# Patient Record
Sex: Female | Born: 1935 | Race: Black or African American | Hispanic: No | Marital: Single | Smoking: Never smoker
Health system: Southern US, Community
[De-identification: ages and names within clinical notes are randomized; demographics above are authoritative.]

## PROBLEM LIST (undated history)

## (undated) DIAGNOSIS — M1990 Unspecified osteoarthritis, unspecified site: Secondary | ICD-10-CM

---

## 2017-04-07 ENCOUNTER — Ambulatory Visit (INDEPENDENT_AMBULATORY_CARE_PROVIDER_SITE_OTHER): Payer: Medicare Other | Admitting: Podiatry

## 2017-04-07 ENCOUNTER — Encounter: Payer: Self-pay | Admitting: Podiatry

## 2017-04-07 VITALS — BP 153/69 | HR 68 | Resp 16

## 2017-04-07 DIAGNOSIS — M79676 Pain in unspecified toe(s): Secondary | ICD-10-CM

## 2017-04-07 DIAGNOSIS — B351 Tinea unguium: Secondary | ICD-10-CM

## 2017-04-07 NOTE — Progress Notes (Signed)
  Subjective:  Patient ID: Barbette OrGermaine Enis, female    DOB: [birth year],  MRN: [id number] HPI Chief Complaint  Patient presents with  . Debridement    Requesting nail care and exam - trim toenails, hallux left gets tender sometimes, concerned about toes being tight together    84 y.o. female presents with the above complaint.     No past medical history on file.   Current Outpatient Medications:  .  acetaminophen (TYLENOL) 500 MG tablet, Take 500 mg by mouth as needed., Disp: , Rfl:  .  ibuprofen (ADVIL,MOTRIN) 200 MG tablet, Take 200 mg by mouth as needed., Disp: , Rfl:   No Known Allergies Review of Systems  All other systems reviewed and are negative.  Objective:   Vitals:   04/07/17 1039  BP: (!) 153/69  Pulse: 68  Resp: 16    General: Well developed, nourished, in no acute distress, alert and oriented x3   Dermatological: Skin is warm, dry and supple bilateral. Nails x 10 are well maintained; remaining integument appears unremarkable at this time. There are no open sores, no preulcerative lesions, no rash or signs of infection present.  Nails are thick sharply incurvated painful with some discoloration.  Nails are dystrophic possibly mycotic it appears that they at least have superficial white onychomycosis.  Vascular: Dorsalis Pedis artery and Posterior Tibial artery pedal pulses are 2/4 bilateral with immedate capillary fill time. Pedal hair growth present. No varicosities and no lower extremity edema present bilateral.   Neruologic: Grossly intact via light touch bilateral. Vibratory intact via tuning fork bilateral. Protective threshold with Semmes Wienstein monofilament intact to all pedal sites bilateral. Patellar and Achilles deep tendon reflexes 2+ bilateral. No Babinski or clonus noted bilateral.   Musculoskeletal: No gross boney pedal deformities bilateral. No pain, crepitus, or limitation noted with foot and ankle range of motion bilateral. Muscular strength 5/5  in all groups tested bilateral.  Gait: Unassisted, Nonantalgic.    Radiographs:  None taken today  Assessment & Plan:   Assessment: Pain in limb secondary to sharp incurvated nail margins and thick dystrophic possibly mycotic nails.  Plan: Debridement of toenails 1 through 5 bilateral.  Follow-up with Dr. Caryn BeeMaier for further debridements.     Aloni Chuang T. [place], [place]

## 2017-07-07 ENCOUNTER — Ambulatory Visit: Payer: Medicare Other | Admitting: Podiatry

## 2017-07-10 ENCOUNTER — Ambulatory Visit: Payer: Medicare Other | Admitting: Podiatry

## 2017-07-17 ENCOUNTER — Encounter: Payer: Self-pay | Admitting: Podiatry

## 2017-07-17 ENCOUNTER — Ambulatory Visit (INDEPENDENT_AMBULATORY_CARE_PROVIDER_SITE_OTHER): Payer: Medicare Other | Admitting: Podiatry

## 2017-07-17 DIAGNOSIS — B351 Tinea unguium: Secondary | ICD-10-CM | POA: Diagnosis not present

## 2017-07-17 DIAGNOSIS — M79676 Pain in unspecified toe(s): Secondary | ICD-10-CM | POA: Diagnosis not present

## 2017-07-17 DIAGNOSIS — L608 Other nail disorders: Secondary | ICD-10-CM

## 2017-07-17 NOTE — Progress Notes (Addendum)
Complaint:  Visit Type: Patient returns to my office for continued preventative foot care services. Complaint: Patient states" my nails have grown long and thick and ingrown  and become painful to walk and wear shoes" . The patient presents for preventative foot care services. No changes to ROS>  Patient presents to the office with daughter in law.  Podiatric Exam: Vascular: dorsalis pedis and posterior tibial pulses are palpable bilateral. Capillary return is immediate. Temperature gradient is WNL. Skin turgor WNL  Sensorium: Normal Semmes Weinstein monofilament test. Normal tactile sensation bilaterally. Nail Exam: Pt has thick disfigured discolored nails with subungual debris noted bilateral entire nail hallux through fifth toenails.  Multiple pincer toenails  B/L. Ulcer Exam: There is no evidence of ulcer or pre-ulcerative changes or infection. Orthopedic Exam: Muscle tone and strength are WNL. No limitations in general ROM. No crepitus or effusions noted. Foot type and digits show no abnormalities. Bony prominences are unremarkable. Skin: No Porokeratosis. No infection or ulcers  Diagnosis:  Onychomycosis, , Pain in right toe, pain in left toes  Treatment & Plan Procedures and Treatment: Consent by patient was obtained for treatment procedures.   Debridement of mycotic and hypertrophic toenails, 1 through 5 bilateral and clearing of subungual debris. No ulceration, no infection noted.  Return Visit-Office Procedure: Patient instructed to return to the office for a follow up visit 3 months for continued evaluation and treatment.    Helane Gunther DPM

## 2017-10-15 ENCOUNTER — Encounter: Payer: Self-pay | Admitting: Podiatry

## 2017-10-15 ENCOUNTER — Ambulatory Visit (INDEPENDENT_AMBULATORY_CARE_PROVIDER_SITE_OTHER): Payer: Medicare Other | Admitting: Podiatry

## 2017-10-15 DIAGNOSIS — B351 Tinea unguium: Secondary | ICD-10-CM

## 2017-10-15 DIAGNOSIS — M79676 Pain in unspecified toe(s): Secondary | ICD-10-CM

## 2017-10-15 NOTE — Progress Notes (Signed)
She presents today for follow-up of her painful toenails.  Toenails are long thick yellow dystrophic clinically mycotic painful palpation as well as debridement.  Assessment: Pain in limb secondary onychomycosis.  Plan: Debridement of toenails 1 through 5 bilateral.

## 2017-12-31 DIAGNOSIS — K5909 Other constipation: Secondary | ICD-10-CM | POA: Insufficient documentation

## 2017-12-31 DIAGNOSIS — Z0000 Encounter for general adult medical examination without abnormal findings: Secondary | ICD-10-CM | POA: Insufficient documentation

## 2017-12-31 DIAGNOSIS — M159 Polyosteoarthritis, unspecified: Secondary | ICD-10-CM | POA: Insufficient documentation

## 2018-01-15 DIAGNOSIS — N641 Fat necrosis of breast: Secondary | ICD-10-CM | POA: Insufficient documentation

## 2018-01-21 ENCOUNTER — Ambulatory Visit (INDEPENDENT_AMBULATORY_CARE_PROVIDER_SITE_OTHER): Payer: Medicare Other | Admitting: Podiatry

## 2018-01-21 ENCOUNTER — Encounter: Payer: Self-pay | Admitting: Podiatry

## 2018-01-21 DIAGNOSIS — B351 Tinea unguium: Secondary | ICD-10-CM | POA: Diagnosis not present

## 2018-01-21 DIAGNOSIS — M79676 Pain in unspecified toe(s): Secondary | ICD-10-CM | POA: Diagnosis not present

## 2018-01-21 NOTE — Progress Notes (Signed)
She presents today chief complaint of painful elongated toenails 1 through 5 bilateral.  Objective: Toenails are long thick yellow dystrophic with mycotic painful palpation as well as debridement.  Assessment: Pain in limb center onychomycosis.  Plan: Debridement of toenails 1 through 5 bilateral.

## 2018-01-26 ENCOUNTER — Emergency Department: Payer: Medicare Other

## 2018-01-26 ENCOUNTER — Encounter: Payer: Self-pay | Admitting: Emergency Medicine

## 2018-01-26 ENCOUNTER — Emergency Department
Admission: EM | Admit: 2018-01-26 | Discharge: 2018-01-27 | Disposition: A | Discharge status: Home / Self Care | Payer: Medicare Other | Attending: Student in an Organized Health Care Education/Training Program | Admitting: Student in an Organized Health Care Education/Training Program

## 2018-01-26 ENCOUNTER — Other Ambulatory Visit: Payer: Self-pay

## 2018-01-26 DIAGNOSIS — M79604 Pain in right leg: Secondary | ICD-10-CM | POA: Insufficient documentation

## 2018-01-26 DIAGNOSIS — W0110XA Fall on same level from slipping, tripping and stumbling with subsequent striking against unspecified object, initial encounter: Secondary | ICD-10-CM | POA: Insufficient documentation

## 2018-01-26 DIAGNOSIS — Y92009 Unspecified place in unspecified non-institutional (private) residence as the place of occurrence of the external cause: Secondary | ICD-10-CM

## 2018-01-26 DIAGNOSIS — R600 Localized edema: Secondary | ICD-10-CM

## 2018-01-26 DIAGNOSIS — R260 Ataxic gait: Secondary | ICD-10-CM | POA: Diagnosis not present

## 2018-01-26 DIAGNOSIS — W19XXXA Unspecified fall, initial encounter: Secondary | ICD-10-CM

## 2018-01-26 HISTORY — DX: Unspecified osteoarthritis, unspecified site: M19.90

## 2018-01-26 LAB — CBC WITH DIFFERENTIAL/PLATELET
Abs Immature Granulocytes: 0.09 10*3/uL — ABNORMAL HIGH (ref 0.00–0.07)
BASOS PCT: 0 %
Basophils Absolute: 0 10*3/uL (ref 0.0–0.1)
EOS ABS: 0 10*3/uL (ref 0.0–0.5)
EOS PCT: 0 %
HCT: 31.5 % — ABNORMAL LOW (ref 36.0–46.0)
Hemoglobin: 10.2 g/dL — ABNORMAL LOW (ref 12.0–15.0)
Immature Granulocytes: 1 %
Lymphocytes Relative: 11 %
Lymphs Abs: 1 10*3/uL (ref 0.7–4.0)
MCH: 32.1 pg (ref 26.0–34.0)
MCHC: 32.4 g/dL (ref 30.0–36.0)
MCV: 99.1 fL (ref 80.0–100.0)
MONO ABS: 1.3 10*3/uL — AB (ref 0.1–1.0)
Monocytes Relative: 14 %
Neutro Abs: 7 10*3/uL (ref 1.7–7.7)
Neutrophils Relative %: 74 %
PLATELETS: 292 10*3/uL (ref 150–400)
RBC: 3.18 MIL/uL — AB (ref 3.87–5.11)
RDW: 13.2 % (ref 11.5–15.5)
WBC: 9.4 10*3/uL (ref 4.0–10.5)
nRBC: 0 % (ref 0.0–0.2)

## 2018-01-26 LAB — URINALYSIS, COMPLETE (UACMP) WITH MICROSCOPIC
Bacteria, UA: NONE SEEN
Bilirubin Urine: NEGATIVE
Glucose, UA: NEGATIVE mg/dL
KETONES UR: 5 mg/dL — AB
Leukocytes, UA: NEGATIVE
Nitrite: NEGATIVE
PH: 6 (ref 5.0–8.0)
Protein, ur: NEGATIVE mg/dL
SPECIFIC GRAVITY, URINE: 1.014 (ref 1.005–1.030)
SQUAMOUS EPITHELIAL / LPF: NONE SEEN (ref 0–5)

## 2018-01-26 LAB — COMPREHENSIVE METABOLIC PANEL
ALT: 17 U/L (ref 0–44)
AST: 27 U/L (ref 15–41)
Albumin: 3.5 g/dL (ref 3.5–5.0)
Alkaline Phosphatase: 92 U/L (ref 38–126)
Anion gap: 4 — ABNORMAL LOW (ref 5–15)
BUN: 24 mg/dL — AB (ref 8–23)
CALCIUM: 8.8 mg/dL — AB (ref 8.9–10.3)
CHLORIDE: 105 mmol/L (ref 98–111)
CO2: 29 mmol/L (ref 22–32)
CREATININE: 0.82 mg/dL (ref 0.44–1.00)
GFR calc non Af Amer: 59 mL/min — ABNORMAL LOW (ref >60)
Glucose, Bld: 123 mg/dL — ABNORMAL HIGH (ref 70–99)
POTASSIUM: 4.2 mmol/L (ref 3.5–5.1)
SODIUM: 138 mmol/L (ref 135–145)
TOTAL PROTEIN: 6.9 g/dL (ref 6.5–8.1)
Total Bilirubin: 0.9 mg/dL (ref 0.3–1.2)

## 2018-01-26 LAB — TROPONIN I

## 2018-01-26 MED ORDER — SODIUM CHLORIDE 0.9 % IV BOLUS
500.0000 mL | Freq: Once | INTRAVENOUS | Status: AC
  Administered 2018-01-26: 500 mL via INTRAVENOUS

## 2018-01-26 MED ORDER — ACETAMINOPHEN 325 MG PO TABS
650.0000 mg | ORAL_TABLET | Freq: Once | ORAL | Status: AC
  Administered 2018-01-26: 650 mg via ORAL
  Filled 2018-01-26: qty 2

## 2018-01-26 NOTE — ED Triage Notes (Signed)
Patient presents to the ED post fall today.  Patient states her head didn't hit the floor.  Patient denies loss of consciousness.  Patient states she was walking to get to her recliner.  Patient states she is unsure of why she fell today.  Patient states this is her second fall within a week.  Patient's right leg appears very swollen and patient states this occurred prior to today's fall.  Patient reports more difficulty walking than usual.

## 2018-01-26 NOTE — ED Provider Notes (Signed)
[facility] Emergency Department Provider Note  ____________________________________________   First MD Initiated Contact with Patient 01/26/18 1858     (approximate)  I have reviewed the triage vital signs and the nursing notes.   HISTORY  Chief Complaint Fall and Leg Swelling  HPI Ariel Robinson is a 84 y.o. female who presents to the emergency department for treatment and evaluation after a non-syncopal fall today.  She recalls walking to get into her recliner and then fell for unknown reason.  She states this is her second fall within a week.  She states that her right lower extremity is more painful and swollen today than usual.  Over the past 24 hours, she has been unable to ambulate without extreme right lower extremity pain. She lives in an independent living facility. Family have been attempting to get additional help, but it has not happened yet. Patient states that as long as she doesn't attempt to move her leg, it doesn't hurt. No alleviating measures prior to arrival.  Past Medical History:  Diagnosis Date  . Arthritis     Patient Active Problem List   Diagnosis Date Noted  . 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 Chronic constipation 12/31/2017  . Generalized osteoarthritis of multiple sites 12/31/2017  . Medicare annual wellness visit, initial 12/31/2017    History reviewed. No pertinent surgical history.  Prior to Admission medications   Medication Sig Start Date End Date Taking? Authorizing Provider  acetaminophen (TYLENOL) 500 MG tablet Take 500 mg by mouth as needed.    [provider]  Ferrous Gluconate 324 (37.5 Fe) MG TABS Take by mouth. 01/16/18   [provider]  ibuprofen (ADVIL,MOTRIN) 200 MG tablet Take 200 mg by mouth as needed.    [provider]    Allergies Other  No family history on file.  Social History Social History   Tobacco Use  . Smoking status: Never Smoker  . Smokeless  tobacco: Never Used  Substance Use Topics  . Alcohol use: No    Frequency: Never  . Drug use: Not on file    Review of Systems  Constitutional: No fever/chills Eyes: No visual changes. ENT: No sore throat. Cardiovascular: Denies chest pain. Respiratory: Denies shortness of breath. Gastrointestinal: No abdominal pain.  No nausea, no vomiting.  No diarrhea.  No constipation. Genitourinary: Negative for dysuria. Musculoskeletal: Negative for back pain. Positive for RLE pain. Skin: Negative for rash. Negative for open wound or lesion.  Neurological: Negative for headaches, focal weakness or numbness.  ____________________________________________   PHYSICAL EXAM:  VITAL SIGNS: ED Triage Vitals [01/26/18 1714]  Enc Vitals Group     BP 136/88     Pulse Rate 94     Resp 18     Temp 97.8 F (36.6 C)     Temp Source Oral     SpO2 96 %     Weight 104 lb (47.2 kg)     Height 5\' 4"  (1.626 m)     Head Circumference      Peak Flow      Pain Score      Pain Loc      Pain Edu?      Excl. in GC?     Constitutional: Alert and oriented. Well appearing and in no acute distress. Eyes: Conjunctivae are normal Head: Atraumatic. Nose: No congestion/rhinnorhea. Mouth/Throat: Mucous membranes are moist.  Oropharynx non-erythematous. Neck: No stridor. No mildline tenderness. Cardiovascular: Normal rate, regular rhythm. Grossly normal heart  sounds.  Good peripheral circulation. Respiratory: Normal respiratory effort.  No retractions. Lungs CTAB. Gastrointestinal: Soft and nontender. No distention. No abdominal bruits. No CVA tenderness. Musculoskeletal: RLE nonpitting diffuse edema from above knee to toes. FROM of ankle and toes. Limited flexion of the right knee secondary to pain. Neurologic:  Normal speech and language. No gross focal neurologic deficits are appreciated. Skin:  Skin is warm, dry and intact. No rash noted. Diffuse edema of the RLE without open wound or lesion.    Psychiatric: Mood and affect are normal. Speech and behavior are normal.  ____________________________________________   LABS (all labs ordered are listed, but only abnormal results are displayed)  Labs Reviewed  COMPREHENSIVE METABOLIC PANEL - Abnormal; Notable for the following components:      Result Value   Glucose, Bld 123 (*)    BUN 24 (*)    Calcium 8.8 (*)    GFR calc non Af Amer 59 (*)    Anion gap 4 (*)    All other components within normal limits  CBC WITH DIFFERENTIAL/PLATELET - Abnormal; Notable for the following components:   RBC 3.18 (*)    Hemoglobin 10.2 (*)    HCT 31.5 (*)    Monocytes Absolute 1.3 (*)    Abs Immature Granulocytes 0.09 (*)    All other components within normal limits  URINALYSIS, COMPLETE (UACMP) WITH MICROSCOPIC - Abnormal; Notable for the following components:   Color, Urine YELLOW (*)    APPearance CLEAR (*)    Hgb urine dipstick SMALL (*)    Ketones, ur 5 (*)    All other components within normal limits  TROPONIN I   ____________________________________________  EKG  ED ECG REPORT I, Emalyn Schou, FNP-BC, personally viewed and interpreted this ECG.   Date: 01/26/2018  EKG Time: 2037  Rate: 84  Rhythm: sinus rhythm with frequent PAC  Axis: normal  Intervals:none  ST&T Change: No ST elevation  ____________________________________________  RADIOLOGY  ED MD interpretation:  Nonocclusive thrombus in the right femoral vein without occlusive DVT.  Official radiology report(s): Dg Tibia/fibula Right  Result Date: 01/26/2018 CLINICAL DATA:  Initial evaluation for acute trauma, fall. EXAM: RIGHT FEMUR 2 VIEWS; RIGHT TIBIA AND FIBULA - 2 VIEW COMPARISON:  None. FINDINGS: No acute fracture or dislocation seen about the right femur or right tibia/fibula. Severe osteoarthritic changes present at the right hip with extensive joint space loss, subchondral sclerosis, and osteophytosis. Associated chronic flattening of the right femoral  head. Osteoarthritic changes noted about the right knee as well, greatest within the lateral femorotibial joint space compartment. Bones are osteopenic. No acute soft tissue abnormality. Vascular calcifications noted posterior to the knee. IMPRESSION: 1. No acute fracture or dislocation about the right femur or right tibia/fibula. 2. Severe degenerative osteoarthrosis at the right hip. 3. Osteopenia. Electronically Signed   By: Rise Mu M.D.   On: 01/26/2018 20:40   Ct Head Wo Contrast  Result Date: 01/26/2018 CLINICAL DATA:  84 year old female status post fall today. Ataxia. EXAM: CT HEAD WITHOUT CONTRAST TECHNIQUE: Contiguous axial images were obtained from the base of the skull through the vertex without intravenous contrast. COMPARISON:  None. FINDINGS: Brain: Cerebral volume is within normal limits for age. Patchy and confluent bilateral cerebral white matter hypodensity. Small area of chronic appearing cortical encephalomalacia in the left middle frontal gyrus (series 2, image 19). This is possible small chronic infarct in the left cerebellum on series 2, image 7. No midline shift, ventriculomegaly, mass effect, evidence of mass  lesion, intracranial hemorrhage or evidence of cortically based acute infarction. Vascular: Calcified atherosclerosis at the skull base. No suspicious intracranial vascular hyperdensity. Skull: Chronic and possibly congenital appearing deformities of the vertex and bilateral posterior convexities with smooth osseous remodeling (e.g. Sagittal image 24). No acute osseous abnormality identified. Sinuses/Orbits: Bilateral tympanic cavities and mastoids are clear. Chronic sphenoid sinusitis with mucoperiosteal thickening and some partially calcified internal contents. Opacified left posterior ethmoid air cell. Other visible paranasal sinuses are well pneumatized. Other: Chronic appearing posterolateral and vertex scalp and skull deformities. No scalp hematoma, acute orbit  or scalp soft tissue finding. IMPRESSION: 1. No acute intracranial abnormality. No acute traumatic injury identified. 2. Evidence of chronic small and medium-sized vessel ischemia. 3. Chronic sphenoid sinusitis. Electronically Signed   By: Odessa Fleming M.D.   On: 01/26/2018 20:55   US Venous Img Lower Unilateral Right  Result Date: 01/26/2018 CLINICAL DATA:  84 year old female with right lower extremity pain and swelling for 2 days. EXAM: RIGHT LOWER EXTREMITY VENOUS DOPPLER ULTRASOUND TECHNIQUE: Gray-scale sonography with graded compression, as well as color Doppler and duplex ultrasound were performed to evaluate the lower extremity deep venous systems from the level of the common femoral vein and including the common femoral, femoral, profunda femoral, popliteal and calf veins including the posterior tibial, peroneal and gastrocnemius veins when visible. The superficial great saphenous vein was also interrogated. Spectral Doppler was utilized to evaluate flow at rest and with distal augmentation maneuvers in the common femoral, femoral and popliteal veins. COMPARISON:  None. FINDINGS: Contralateral Common Femoral Vein: Respiratory phasicity is normal and symmetric with the symptomatic side. No evidence of thrombus. Normal compressibility. Common Femoral Vein: No evidence of thrombus. Normal compressibility, respiratory phasicity and response to augmentation. Saphenofemoral Junction: No evidence of thrombus. Normal compressibility and flow on color Doppler imaging. Profunda Femoral Vein: No evidence of thrombus. Normal compressibility and flow on color Doppler imaging. Femoral Vein: The proximal right femoral vein is incompletely compressible as seen on image 17, but remains patent. Popliteal Vein: No evidence of thrombus. Normal compressibility, respiratory phasicity and response to augmentation. Calf Veins: Suboptimal but no thrombus identified. Compressibility and color Doppler seen within normal limits. Venous  Reflux:  None. Other Findings: There is subcutaneous edema posterior to the knee (image 41 and 42). No discrete fluid collection. IMPRESSION: 1. Difficult to exclude nonocclusive thrombus in the right femoral vein (image 17). But there is no occlusive DVT identified in the right lower extremity. 2. Nonspecific subcutaneous edema posterior to the knee. No discrete popliteal fluid collection identified. Electronically Signed   By: Odessa Fleming M.D.   On: 01/26/2018 18:53   Dg Femur Min 2 Views Right  Result Date: 01/26/2018 CLINICAL DATA:  Initial evaluation for acute trauma, fall. EXAM: RIGHT FEMUR 2 VIEWS; RIGHT TIBIA AND FIBULA - 2 VIEW COMPARISON:  None. FINDINGS: No acute fracture or dislocation seen about the right femur or right tibia/fibula. Severe osteoarthritic changes present at the right hip with extensive joint space loss, subchondral sclerosis, and osteophytosis. Associated chronic flattening of the right femoral head. Osteoarthritic changes noted about the right knee as well, greatest within the lateral femorotibial joint space compartment. Bones are osteopenic. No acute soft tissue abnormality. Vascular calcifications noted posterior to the knee. IMPRESSION: 1. No acute fracture or dislocation about the right femur or right tibia/fibula. 2. Severe degenerative osteoarthrosis at the right hip. 3. Osteopenia. Electronically Signed   By: Rise Mu M.D.   On: 01/26/2018 20:40    ____________________________________________  PROCEDURES  Procedure(s) performed: None  Procedures  Critical Care performed: No  ____________________________________________   INITIAL IMPRESSION / ASSESSMENT AND PLAN / ED COURSE  As part of my medical decision making, I reviewed the following data within the electronic MEDICAL RECORD NUMBER Notes from prior ED visits   84 year old female presenting to the emergency department for treatment and evaluation after a non-syncopal fall.  Patient complains of  severe right lower extremity pain and is now unable to ambulate.  She had a more traumatic fall January 15, 2018 for which she was evaluated by urgent care and primary care. Family are concerned that she needs additional home assistance since she now is unable to tolerate ambulation due to leg pain.  Ultrasound of the right lower extremity is negative for occlusive deep vein thrombosis.  There is a nonocclusive superficial clot, otherwise exam is negative.  Patient does not qualify for admission based on labs and imaging.  She will be held in the emergency department for PT, OT, and case management.  This was discussed with the patient and her family who agree with the plan.      ____________________________________________   FINAL CLINICAL IMPRESSION(S) / ED DIAGNOSES  Final diagnoses:  Leg edema, right  Fall at home, initial encounter  Leg pain, right     ED Discharge Orders    None       Note:  This document was prepared using Dragon voice recognition software and may include unintentional dictation errors.    Chinita Pester, FNP 01/27/18 1610    Willy Eddy, MD 01/27/18 [phone]

## 2018-01-27 DIAGNOSIS — M79604 Pain in right leg: Secondary | ICD-10-CM | POA: Diagnosis not present

## 2018-01-27 MED ORDER — ACETAMINOPHEN 500 MG PO TABS: 500.0000 mg | ORAL_TABLET | Freq: Three times a day (TID) | ORAL | Status: DC | PRN

## 2018-01-27 MED ORDER — FERROUS GLUCONATE 324 (38 FE) MG PO TABS
324.0000 mg | ORAL_TABLET | Freq: Every morning | ORAL | Status: DC
  Filled 2018-01-27 (×2): qty 1

## 2018-01-27 MED ORDER — IBUPROFEN 400 MG PO TABS: 200.0000 mg | ORAL_TABLET | ORAL | Status: DC | PRN

## 2018-01-27 NOTE — ED Notes (Signed)
[facility]  Removed EKG stickers from chest Pt ready for transport to facility   lmedt

## 2018-01-27 NOTE — ED Notes (Signed)
Patient sat up in bed and given breakfast. Family at bedside. Will continue to monitor.

## 2018-01-27 NOTE — ED Notes (Signed)
Breakfast meal tray ordered for patient at this time.

## 2018-01-27 NOTE — ED Notes (Signed)
Pharmacy called and notified that this RN is unable to pull ferrous gluconate from pyxis. Charles from pharmacy states he will send it via tube system. Will administer once medication is available.

## 2018-01-27 NOTE — ED Notes (Signed)
[facility]

## 2018-01-27 NOTE — ED Notes (Signed)
Pt in room with family member at bedside. Denies pain. Informed that lunch will be brought soon.

## 2018-01-27 NOTE — Evaluation (Signed)
Occupational Therapy Evaluation Patient Details Name: Ariel Robinson MRN: [id number] DOB: 1919-06-30 Today's Date: 01/27/2018    History of Present Illness 84 y.o. female who presented to ED for treatment and evaluation after a non-syncopal fall today.  x2 this week. She states that her right lower extremity is more painful and swollen today than usual.     Clinical Impression   Pt seen for OT evaluation this date. Prior to hospital admission, pt was independent with ADL, mod indep with mobility using rollator and living by herself in an ILF. Pt requires assist for transportation and groceries/meals from family but otherwise independent, including medication mgt (vitamins, pain meds, no prescription meds).  Currently pt demonstrates impairments in strength, balance, activity tolerance, and pain requiring mod-max assist for LB ADL and CGA to Min A for mobility with a 2WW. Pt/family educated in home/routines modifications and falls prevention strategies to maximize safety/independence in the home. Pt would benefit from skilled OT to address noted impairments and functional limitations (see below for any additional details) in order to maximize safety and independence while minimizing falls risk and caregiver burden.  Upon hospital discharge, recommend pt discharge to STR.    Follow Up Recommendations  SNF    Equipment Recommendations  Other (comment)(TBD - may benefit from [facility])    Recommendations for Other Services       Precautions / Restrictions Precautions Precautions: Fall Restrictions Weight Bearing Restrictions: No      Mobility Bed Mobility Overal bed mobility: Needs Assistance Bed Mobility: Supine to Sit;Sit to Supine     Supine to sit: Min guard;HOB elevated Sit to supine: Mod assist   General bed mobility comments: for LE management. Assist provided at ankles for repositioning in bed (bridges to scoot up in bed), verbal/tactile cues needed  Transfers Overall  transfer level: Needs assistance Equipment used: Rolling walker (2 wheeled) Transfers: Sit to/from Stand Sit to Stand: Min guard;From elevated surface              Balance Overall balance assessment: Mild deficits observed, not formally tested                                         ADL either performed or assessed with clinical judgement   ADL Overall ADL's : Needs assistance/impaired Eating/Feeding: Sitting;Independent   Grooming: Sitting;Independent   Upper Body Bathing: Sitting;Supervision/ safety   Lower Body Bathing: Sit to/from stand;Moderate assistance;Maximal assistance   Upper Body Dressing : Sitting;Supervision/safety   Lower Body Dressing: Sit to/from stand;Maximal assistance;Moderate assistance   Toilet Transfer: RW;Ambulation;Min guard;Minimal assistance;BSC                   Vision Baseline Vision/History: Wears glasses Wears Glasses: At all times Patient Visual Report: No change from baseline       Perception     Praxis      Pertinent Vitals/Pain Pain Assessment: Faces Faces Pain Scale: Hurts even more Pain Location: BLE, R>L, pt states the pain "moves around" and "comes and goes" Pain Descriptors / Indicators: Aching;Sharp;Grimacing;Guarding Pain Intervention(s): Limited activity within patient's tolerance;Monitored during session;Premedicated before session;Repositioned     Hand Dominance Right   Extremity/Trunk Assessment Upper Extremity Assessment Upper Extremity Assessment: Generalized weakness(grossly 3+ to 4-/5 bilaterally) RUE Deficits / Details: grossly 3+/5 including grip LUE Deficits / Details: grossly 3+/5 including grip   Lower Extremity Assessment Lower Extremity Assessment: Generalized weakness(pt unable  to lift BLE off floor without pain, mild +1 pitting edema R>L) RLE Deficits / Details: presence of edema       Communication Communication Communication: HOH   Cognition Arousal/Alertness:  Awake/alert Behavior During Therapy: WFL for tasks assessed/performed Overall Cognitive Status: Within Functional Limits for tasks assessed                                 General Comments: with cues, pt able to recall name of hospital, otherwise alert and oriented   General Comments       Exercises Other Exercises Other Exercises: pt/family instructed in falls prevention strategies, specific to bathroom    Shoulder Instructions      Home Living Family/patient expects to be discharged to:: Private residence(ILF) Living Arrangements: Alone Available Help at Discharge: Family Type of Home: Independent living facility Home Access: Level entry     Home Layout: One level     Bathroom Shower/Tub: Producer, television/film/videoWalk-in shower   Bathroom Toilet: Standard Bathroom Accessibility: Yes   Home Equipment: Environmental consultantWalker - 4 wheels;Toilet riser          Prior Functioning/Environment Level of Independence: Needs assistance  Gait / Transfers Assistance Needed: Patient ambulates short household distances with rollator ADL's / Homemaking Assistance Needed: until recent falls, family reports patient was dressing/bathing independently   Comments: Family has been providing meals/IADLs assistance. Reports patient has difficulty ambulating to dining facilities due to incline that is along the way. Reports x2 falls this week. Independent with med mgt (does not take prescription medication)        OT Problem List: Decreased strength;Decreased knowledge of use of DME or AE;Increased edema;Decreased activity tolerance;Pain;Impaired balance (sitting and/or standing)      OT Treatment/Interventions: Self-care/ADL training;Balance training;Therapeutic exercise;Therapeutic activities;Energy conservation;DME and/or AE instruction;Patient/family education    OT Goals(Current goals can be found in the care plan section) Acute Rehab OT Goals Patient Stated Goal: pt wants to return to PLOF OT Goal  Formulation: With patient/family Time For Goal Achievement: 02/10/18 Potential to Achieve Goals: Good ADL Goals Pt Will Perform Lower Body Dressing: with min assist;sit to/from stand;with adaptive equipment Pt Will Transfer to Toilet: with supervision;ambulating(elevated commode, LRAD for amb)  OT Frequency: Min 2X/week   Barriers to D/C:            Co-evaluation              AM-PAC PT "6 Clicks" Daily Activity     Outcome Measure Help from another person eating meals?: None Help from another person taking care of personal grooming?: None Help from another person toileting, which includes using toliet, bedpan, or urinal?: A Little Help from another person bathing (including washing, rinsing, drying)?: A Lot Help from another person to put on and taking off regular upper body clothing?: A Little Help from another person to put on and taking off regular lower body clothing?: A Lot 6 Click Score: 18   End of Session    Activity Tolerance: Patient tolerated treatment well Patient left: in bed;with call bell/phone within reach;with family/visitor present  OT Visit Diagnosis: Other abnormalities of gait and mobility (R26.89);Repeated falls (R29.6);Muscle weakness (generalized) (M62.81);Pain Pain - Right/Left: Right(both, R>L) Pain - part of body: Leg                Time: 1007-1035 OT Time Calculation (min): 28 min Charges:  OT General Charges $OT Visit: 1 Visit OT Evaluation $OT Eval Low  Complexity: 1 Low OT Treatments $Self Care/Home Management : 8-22 mins  Richrd Prime, MPH, MS, OTR/L ascom [phone] 01/27/18, 12:23 PM

## 2018-01-27 NOTE — Evaluation (Signed)
Physical Therapy Evaluation Patient Details Name: Ariel Robinson MRN: [id number] DOB: 11-10-1919 Today's Date: 01/27/2018   History of Present Illness  84 y.o. female who presented to ED for treatment and evaluation after a non-syncopal fall today.  x2 this week. She states that her right lower extremity is more painful and swollen today than usual.    Clinical Impression  Patient is easily woken at start of session, A&Ox4, HOH, family at bedside. Family provided majority of PLOF, pt lives alone in independent living facility, previously ambulated with rollator. Pt until recently was performing ADLs, s/p falls has been having difficulty dressing/bathing. Facility provides cleaning, and has dining services but pt has been unable to attend per family due to an inclined surface preventing ambulation. Family has been assisting with meal prep, grocery shopping, etc. The patient and family also report x2 falls this week.   Upon assessment patient needed significantly extended time to perform supine to sit with CGA, sit to supine modAx1 for LE management. Ambulated with RW and CGA~2012ft in room, patient fatigued. Physical assist as well as mod verbal/tactile cues needed to reposition in the bed. The patient exhibited decreased strength, endurance, activity tolerance, mobility, and ambulation compared to PLOF and would benefit from skilled PT to address these limitations. Current recommendation is STR due to level of assist needed, current functional ability status, and decreased caregiver support at home.     Follow Up Recommendations SNF    Equipment Recommendations  Other (comment)(TBD at next venue of care)    Recommendations for Other Services       Precautions / Restrictions Precautions Precautions: Fall Restrictions Weight Bearing Restrictions: No      Mobility  Bed Mobility Overal bed mobility: Needs Assistance Bed Mobility: Supine to Sit;Sit to Supine     Supine to sit: Min  guard;HOB elevated Sit to supine: Mod assist   General bed mobility comments: for LE management. Assist provided at ankles for repositioning in bed (bridges to scoot up in bed), verbal/tactile cues needed  Transfers Overall transfer level: Needs assistance Equipment used: Rolling walker (2 wheeled) Transfers: Sit to/from Stand Sit to Stand: Min guard;From elevated surface            Ambulation/Gait Ambulation/Gait assistance: Min guard Gait Distance (Feet): 12 Feet Assistive device: Rolling walker (2 wheeled)   Gait velocity: significantly decreased   General Gait Details: shuffling gait, very short strides, decreased step length bilaterally  Stairs            Wheelchair Mobility    Modified Rankin (Stroke Patients Only)       Balance Overall balance assessment: Mild deficits observed, not formally tested                                           Pertinent Vitals/Pain Pain Assessment: Faces Faces Pain Scale: Hurts little more Pain Location: LE    Home Living Family/patient expects to be discharged to:: Private residence Living Arrangements: Alone Available Help at Discharge: Family Type of Home: Independent living facility Home Access: Level entry     Home Layout: One level Home Equipment: Environmental consultantWalker - 4 wheels;Toilet riser      Prior Function Level of Independence: Needs assistance   Gait / Transfers Assistance Needed: Patient ambulates short household distances with rollator  ADL's / Homemaking Assistance Needed: until recent falls, family reports patient was dressing/bathing independently  Comments: Family has been providing meals/IADLs assistance. Reports patient has difficulty ambulating to dining facilities due to incline that is along the way. Reports x2 falls this week.     Hand Dominance        Extremity/Trunk Assessment   Upper Extremity Assessment Upper Extremity Assessment: RUE deficits/detail;LUE  deficits/detail RUE Deficits / Details: grossly 3+/5 including grip LUE Deficits / Details: grossly 3+/5 including grip    Lower Extremity Assessment Lower Extremity Assessment: Generalized weakness(Pt had complaints of BLE pain, MMT deferred, patient able to perform heel slides, hip abduction/adduction AAROM) RLE Deficits / Details: presence of edema       Communication   Communication: HOH  Cognition Arousal/Alertness: Awake/alert Behavior During Therapy: WFL for tasks assessed/performed Overall Cognitive Status: Within Functional Limits for tasks assessed                                        General Comments      Exercises     Assessment/Plan    PT Assessment Patient needs continued PT services  PT Problem List Decreased strength;Decreased range of motion;Decreased knowledge of use of DME;Decreased activity tolerance;Decreased safety awareness;Decreased balance;Pain;Decreased mobility       PT Treatment Interventions DME instruction;Balance training;Gait training;Neuromuscular re-education;Functional mobility training;Patient/family education;Therapeutic activities;Therapeutic exercise    PT Goals (Current goals can be found in the Care Plan section)  Acute Rehab PT Goals Patient Stated Goal: Patient wants to get up and moving PT Goal Formulation: With patient Time For Goal Achievement: 02/10/18 Potential to Achieve Goals: Good    Frequency Min 2X/week   Barriers to discharge Decreased caregiver support      Co-evaluation               AM-PAC PT "6 Clicks" Daily Activity  Outcome Measure Difficulty turning over in bed (including adjusting bedclothes, sheets and blankets)?: A Lot Difficulty moving from lying on back to sitting on the side of the bed? : Unable Difficulty sitting down on and standing up from a chair with arms (e.g., wheelchair, bedside commode, etc,.)?: Unable Help needed moving to and from a bed to chair (including a  wheelchair)?: A Lot Help needed walking in hospital room?: A Little Help needed climbing 3-5 steps with a railing? : Total 6 Click Score: 10    End of Session Equipment Utilized During Treatment: Gait belt Activity Tolerance: Patient limited by fatigue;Patient limited by pain Patient left: in bed;with family/visitor present;with call bell/phone within reach Nurse Communication: Mobility status PT Visit Diagnosis: Unsteadiness on feet (R26.81);Other abnormalities of gait and mobility (R26.89);Difficulty in walking, not elsewhere classified (R26.2);Muscle weakness (generalized) (M62.81);History of falling (Z91.81)    Time: 1610-9604 PT Time Calculation (min) (ACUTE ONLY): 42 min   Charges:   PT Evaluation $PT Eval Low Complexity: 1 Low PT Treatments $Therapeutic Activity: 23-37 mins        Olga Coaster PT, DPT 9:32 AM,01/27/18 [phone]

## 2018-01-27 NOTE — Clinical Social Work Note (Signed)
Clinical Social Work Assessment  Patient Details  Name: Barbette OrGermaine Fye MRN: [id number] Date of Birth: June 01, 1919  Date of referral:  01/27/18               Reason for consult:  Facility Placement                Permission sought to share information with:  Family Supports, Magazine features editoracility Contact Representative Permission granted to share information::  Yes, Verbal Permission Granted  Name::     Lenn CalHARWOOD,RALPH Son [phone]  [phone] or Antony OdeaHarwood, Donna Daughter   [phone]   Agency::  SNF admissions  Relationship::     Contact Information:     Housing/Transportation Living arrangements for the past 2 months:  Independent Living Facility Source of Information:  Adult Children, Patient Patient Interpreter Needed:  None Criminal Activity/Legal Involvement Pertinent to Current Situation/Hospitalization:  No - Comment as needed Significant Relationships:  Adult Children Lives with:    Do you feel safe going back to the place where you live?  No Need for family participation in patient care:  Yes (Comment)  Care giving concerns:  Patient and family feel she needs some short term rehab before returning back home.  Social Worker assessment / plan: Patient is a 84 year old female who is alert and oriented x4.  Patient [facility].  Patient has been at independent living for several years.  Patient and family stated that she has not been to rehab before, CSW explained to patient and her family the process to find placement and what to expect at snf.  Patient's family were informed that since she has not had a qualifying stay, they will have to pay for room and board.  Patient's family expressed understanding, and stated that patient is not safe to return back to her home currently, and they would like to pay private for SNF placement.  CSW discussed with patient and family and they gave CSW permission to [place] search [place].  Patient and family did not have any other questions or concerns.  Employment status:  Retired Health and safety inspectornsurance information:  Medicare PT Recommendations:  Skilled Nursing Facility Information / Referral to community resources:     Patient/Family's Response to care:  Patient and family are agreeable to going to SNF for short term rehab.  Patient/Family's Understanding of and Emotional Response to Diagnosis, Current Treatment, and Prognosis:  Patient's family are hopeful that [facility].  Emotional Assessment Appearance:  Appears stated age Attitude/Demeanor/Rapport:    Affect (typically observed):  Appropriate, Stable, Pleasant, Calm Orientation:  Oriented to Self, Oriented to Place, Oriented to  Time, Oriented to Situation Alcohol / Substance use:  Not Applicable Psych involvement (Current and /or in the community):  No (Comment)  Discharge Needs  Concerns to be addressed:  Care Coordination, Lack of Support Readmission within the last 30 days:  No Current discharge risk:  Lack of support system Barriers to Discharge:  No Barriers Identified   Darleene Cleavernterhaus, Jarrin Staley R, LCSWA 01/27/2018, 6:09 PM

## 2018-01-27 NOTE — NC FL2 (Signed)
  [place] MEDICAID FL2 LEVEL OF CARE SCREENING TOOL     IDENTIFICATION  Patient Name: Ariel Robinson Birthdate: 1919-11-08 Sex: female Admission Date (Current Location): 01/26/2018  [place]   Facility and Address:  [facility], [address], [place], [place] [address]      Provider Number: [id number]  Attending Physician Name and Address:  Ileana RoupJames McShane, MD Relative Name and Phone Number:  Lenn CalHARWOOD,RALPH Son [phone]  [phone] or Antony OdeaHarwood, Donna Daughter   [phone]     Current Level of Care: Hospital Recommended Level of Care: Skilled Nursing Facility Prior Approval Number:    Date Approved/Denied:   PASRR Number: [id number]  Discharge Plan: SNF    Current Diagnoses: Patient Active Problem List   Diagnosis Date Noted  . 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 Chronic constipation 12/31/2017  . Generalized osteoarthritis of multiple sites 12/31/2017  . Medicare annual wellness visit, initial 12/31/2017    Orientation RESPIRATION BLADDER Height & Weight     Time, Situation, Place, Self  Normal Continent Weight: 104 lb (47.2 kg) Height:  5\' 4"  (162.6 cm)  BEHAVIORAL SYMPTOMS/MOOD NEUROLOGICAL BOWEL NUTRITION STATUS      Continent Diet(Regular diet)  AMBULATORY STATUS COMMUNICATION OF NEEDS Skin   Limited Assist Verbally Normal                       Personal Care Assistance Level of Assistance  Bathing, Feeding, Dressing Bathing Assistance: Limited assistance Feeding assistance: Independent Dressing Assistance: Limited assistance     Functional Limitations Info  Hearing, Sight, Speech Sight Info: Adequate Hearing Info: Impaired Speech Info: Adequate    SPECIAL CARE FACTORS FREQUENCY  PT (By licensed PT), OT (By licensed OT)     PT Frequency: 5x a week OT Frequency: 5x a week            Contractures Contractures Info: Not present    Additional Factors Info  Code Status,  Allergies Code Status Info: Full Code Allergies Info: Metal objects           Current Medications (01/27/2018):  This is the current hospital active medication list Current Facility-Administered Medications  Medication Dose Route Frequency Provider Last Rate Last Dose  . acetaminophen (TYLENOL) tablet 500 mg  500 mg Oral Q8H PRN Darci CurrentBrown, Zinc N, MD      . ferrous gluconate [employer] [place](FERGON) tablet 324 mg  324 mg Oral q morning - 10a Darci CurrentBrown, [place] N, MD      . ibuprofen (ADVIL,MOTRIN) tablet 200 mg  200 mg Oral PRN Darci CurrentBrown, Barren N, MD       Current Outpatient Medications  Medication Sig Dispense Refill  . acetaminophen (TYLENOL) 500 MG tablet Take 500 mg by mouth as needed.    Marland Kitchen. ibuprofen (ADVIL,MOTRIN) 200 MG tablet Take 200 mg by mouth as needed.    . Ferrous Gluconate 324 (37.5 Fe) MG TABS Take by mouth.       Discharge Medications: Please see discharge summary for a list of discharge medications.  Relevant Imaging Results:  Relevant Lab Results:   Additional Information SSN [id number]  Darleene Cleavernterhaus, Loukisha Gunnerson R, [place]WA

## 2018-01-27 NOTE — ED Notes (Addendum)
PT/OT at bedside.

## 2018-01-27 NOTE — Clinical Social Work Note (Addendum)
CSW presented bed offers to patient's son Miguel RotaRalph Charity, [phone], [facility].  [facility], and they can accept patient today.    Patient to be [facility]P . Patient and family agreeable to plans will transport via ems RN to call report [phone] C wing nurse.  CSW spoke to patient's son, and he is aware that patient will be discharging today under private pay.    [facility] requested that patient's son contact them to discuss payments, CSW gave patient's son the [facility] and the admissions director's name.  CSW updated bedside nurse and physician.  Windell MouldingEric Maxim Bedel, MSW, [facility] ED Covering CSW [phone] 01/27/2018 3:11 PM

## 2018-01-27 NOTE — ED Notes (Addendum)
Patient resting supine on stretcher. Even and non labored respirations noted. Call light within reach. Awaiting social work consult.

## 2018-01-27 NOTE — ED Notes (Signed)
Social work in the room

## 2018-01-27 NOTE — ED Notes (Signed)
Daughter at bedside. Updated on plan of care.

## 2018-01-27 NOTE — Clinical Social Work Note (Signed)
CSW received consult that patient needs short term rehab.  CSW met with patient and her family and explained that since she has not had a qualifying stay she will have to private pay for room and board at SNF.  Patient's son and daughter were at bedside and expressed understanding, patient's family have agreed to private pay and gave CSW permission to begin bed search in [place].  Evette Cristal, MSW, [facility] ED Covering CSW [phone] 01/27/2018 12:42 PM

## 2018-03-23 ENCOUNTER — Ambulatory Visit: Payer: Medicare Other | Admitting: Podiatry

## 2018-04-04 ENCOUNTER — Encounter: Payer: Self-pay | Admitting: Emergency Medicine

## 2018-04-04 ENCOUNTER — Emergency Department: Payer: Medicare Other

## 2018-04-04 ENCOUNTER — Emergency Department
Admission: EM | Admit: 2018-04-04 | Discharge: 2018-04-04 | Disposition: A | Discharge status: Home / Self Care | Payer: Medicare Other | Attending: Emergency Medicine | Admitting: Emergency Medicine

## 2018-04-04 ENCOUNTER — Other Ambulatory Visit: Payer: Self-pay

## 2018-04-04 DIAGNOSIS — S0990XA Unspecified injury of head, initial encounter: Secondary | ICD-10-CM | POA: Insufficient documentation

## 2018-04-04 DIAGNOSIS — W19XXXA Unspecified fall, initial encounter: Secondary | ICD-10-CM

## 2018-04-04 DIAGNOSIS — W07XXXA Fall from chair, initial encounter: Secondary | ICD-10-CM | POA: Insufficient documentation

## 2018-04-04 DIAGNOSIS — Y92009 Unspecified place in unspecified non-institutional (private) residence as the place of occurrence of the external cause: Secondary | ICD-10-CM | POA: Diagnosis not present

## 2018-04-04 DIAGNOSIS — Y9389 Activity, other specified: Secondary | ICD-10-CM | POA: Diagnosis not present

## 2018-04-04 DIAGNOSIS — Y998 Other external cause status: Secondary | ICD-10-CM | POA: Diagnosis not present

## 2018-04-04 NOTE — Discharge Instructions (Addendum)
Please be careful.  Make sure you try to get help for transfers.  Return for any further problems.

## 2018-04-04 NOTE — ED Provider Notes (Signed)
[facility] Emergency Department Provider Note   ____________________________________________   First MD Initiated Contact with Patient 04/04/18 1043     (approximate)  I have reviewed the triage vital signs and the nursing notes. This appears to have resolved although I did not examine her breast as she is in the hallway now.  HISTORY  Chief Complaint Fall   HPI Ariel Robinson is a 85 y.o. female who was attempting to transfer herself from one chair to another when she slipped and fell.  She does not think she hit her head.  She is not complaining of any pain anywhere but wants to be checked out.  Her family is with her and also want her to be checked out.  She had fallen about a month ago and sustained a severe bruise and hematoma to the breast and chest wall.   Past Medical History:  Diagnosis Date  . Arthritis     Patient Active Problem List   Diagnosis Date Noted  . 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 Chronic constipation 12/31/2017  . Generalized osteoarthritis of multiple sites 12/31/2017  . Medicare annual wellness visit, initial 12/31/2017    History reviewed. No pertinent surgical history.  Prior to Admission medications   Medication Sig Start Date End Date Taking? Authorizing Provider  ibuprofen (ADVIL,MOTRIN) 200 MG tablet Take 200 mg by mouth as needed.   Yes [provider]    Allergies Other  History reviewed. No pertinent family history.  Social History Social History   Tobacco Use  . Smoking status: Never Smoker  . Smokeless tobacco: Never Used  Substance Use Topics  . Alcohol use: No    Frequency: Never  . Drug use: Not on file    Review of Systems  Constitutional: No fever/chills Eyes: No visual changes. ENT: No sore throat. Cardiovascular: Denies chest pain. Respiratory: Denies shortness of breath. Gastrointestinal: No abdominal pain.  No nausea, no vomiting.  No diarrhea.  No  constipation. Genitourinary: Negative for dysuria. Musculoskeletal: Negative for back pain. Skin: Negative for rash. Neurological: Negative for headaches, focal weakness ____________________________________________   PHYSICAL EXAM:  VITAL SIGNS: ED Triage Vitals [04/04/18 1043]  Enc Vitals Group     BP (!) 131/91     Pulse Rate 87     Resp 18     Temp (!) 97.4 F (36.3 C)     Temp src      SpO2 98 %     Weight 104 lb (47.2 kg)     Height 5\' 7"  (1.702 m)     Head Circumference      Peak Flow      Pain Score 0     Pain Loc      Pain Edu?      Excl. in GC?     Constitutional: Alert and oriented. Well appearing and in no acute distress. Eyes: Conjunctivae are normal. PERRL. EOMI. Head: Atraumatic. Nose: No congestion/rhinnorhea. Mouth/Throat: Mucous membranes are moist.  Oropharynx non-erythematous. Neck: No stridor.   Cardiovascular: Normal rate, regular rhythm. Grossly normal heart sounds.  Good peripheral circulation. Respiratory: Normal respiratory effort.  No retractions. Lungs CTAB. Gastrointestinal: Soft and nontender. No distention. No abdominal bruits. No CVA tenderness. Musculoskeletal: No lower extremity tenderness nor edema.  Neurologic:  Normal speech and language. No gross focal neurologic deficits are appreciated.  Skin:  Skin is warm, dry and intact. No rash noted. Psychiatric: Mood and affect are normal. Speech and behavior are normal.  ____________________________________________   LABS (all labs ordered are listed, but only abnormal results are displayed)  Labs Reviewed - No data to display ____________________________________________  EKG   ____________________________________________  RADIOLOGY  ED MD interpretation: CT of the head and neck read as negative by radiology  Official radiology report(s): Ct Head Wo Contrast  Result Date: 04/04/2018 CLINICAL DATA:  Fall from standing height with head trauma. EXAM: CT HEAD WITHOUT CONTRAST CT  CERVICAL SPINE WITHOUT CONTRAST TECHNIQUE: Multidetector CT imaging of the head and cervical spine was performed following the standard protocol without intravenous contrast. Multiplanar CT image reconstructions of the cervical spine were also generated. COMPARISON:  CT head from 01/26/2018 FINDINGS: CT HEAD FINDINGS Brain: Mild cerebellar atrophy with prominence adjacent extra-axial spaces as on the prior exam. Periventricular white matter and corona radiata hypodensities favor chronic ischemic microvascular white matter disease. Stable encephalomalacia in the left frontal lobe on image 18/2. No intracranial hemorrhage, mass lesion, or acute CVA. Small remote left cerebellar lacunar infarcts suspected on image 7/2. Vascular: There is atherosclerotic calcification of the cavernous carotid arteries bilaterally. Mild calcification of the vertebral arteries. Skull: Biparietal thinning/flattening of the calvarium as on the prior exam. There is also a region of prominent thinning of the calvarial vertex as on image 38/5. These findings are unchanged from previous. Sinuses/Orbits: Chronic right sphenoid sinusitis with slightly more aeration than on the prior exam. Calcification along nodular portions the right sphenoid sinus could be from polyp or mucous retention cyst. Mild chronic ethmoid sinusitis. Other: No supplemental non-categorized findings. CT CERVICAL SPINE FINDINGS Alignment: There is 2.5 mm degenerative anterolisthesis at C4-5 and 1.5 mm degenerative anterolisthesis at C5-6. Skull base and vertebrae: Degenerative predental space narrowing. No cervical spine fracture is observed. Possible fusion of the right C5-6 facet. Endplate sclerosis and prominent loss of intervertebral disc height at C6-7 with moderate loss of disc height at C5-6 and C7-T1. Soft tissues and spinal canal: Bilateral common carotid atherosclerotic calcification. Disc levels: Uncinate and facet spurring cause osseous foraminal narrowing on  the right at C3-4, C4-5, and C5-6; and on the left at C5-6 and C7-T1. Upper chest: Biapical pleuroparenchymal scarring. Other: No supplemental non-categorized findings. IMPRESSION: 1. No acute intracranial findings; no acute cervical spine findings. 2. Stable cerebellar atrophy and stable left frontal remote infarct. 3. Periventricular white matter and corona radiata hypodensities favor chronic ischemic microvascular white matter disease. 4. Areas of calvarial flattening/narrowing along the biparietal region and along the vertex, unchanged from prior. 5. Chronic right sphenoid and ethmoid sinusitis. 6. Cervical spondylosis and degenerative disc disease causing mostly mild foraminal narrowing at C3-4, C4-5, C5-6, and C7-T1. 7. Atherosclerosis. Electronically Signed   By: Gaylyn Rong M.D.   On: 04/04/2018 12:15   Ct Cervical Spine Wo Contrast  Result Date: 04/04/2018 CLINICAL DATA:  Fall from standing height with head trauma. EXAM: CT HEAD WITHOUT CONTRAST CT CERVICAL SPINE WITHOUT CONTRAST TECHNIQUE: Multidetector CT imaging of the head and cervical spine was performed following the standard protocol without intravenous contrast. Multiplanar CT image reconstructions of the cervical spine were also generated. COMPARISON:  CT head from 01/26/2018 FINDINGS: CT HEAD FINDINGS Brain: Mild cerebellar atrophy with prominence adjacent extra-axial spaces as on the prior exam. Periventricular white matter and corona radiata hypodensities favor chronic ischemic microvascular white matter disease. Stable encephalomalacia in the left frontal lobe on image 18/2. No intracranial hemorrhage, mass lesion, or acute CVA. Small remote left cerebellar lacunar infarcts suspected on image 7/2. Vascular: There is atherosclerotic calcification of the  cavernous carotid arteries bilaterally. Mild calcification of the vertebral arteries. Skull: Biparietal thinning/flattening of the calvarium as on the prior exam. There is also a  region of prominent thinning of the calvarial vertex as on image 38/5. These findings are unchanged from previous. Sinuses/Orbits: Chronic right sphenoid sinusitis with slightly more aeration than on the prior exam. Calcification along nodular portions the right sphenoid sinus could be from polyp or mucous retention cyst. Mild chronic ethmoid sinusitis. Other: No supplemental non-categorized findings. CT CERVICAL SPINE FINDINGS Alignment: There is 2.5 mm degenerative anterolisthesis at C4-5 and 1.5 mm degenerative anterolisthesis at C5-6. Skull base and vertebrae: Degenerative predental space narrowing. No cervical spine fracture is observed. Possible fusion of the right C5-6 facet. Endplate sclerosis and prominent loss of intervertebral disc height at C6-7 with moderate loss of disc height at C5-6 and C7-T1. Soft tissues and spinal canal: Bilateral common carotid atherosclerotic calcification. Disc levels: Uncinate and facet spurring cause osseous foraminal narrowing on the right at C3-4, C4-5, and C5-6; and on the left at C5-6 and C7-T1. Upper chest: Biapical pleuroparenchymal scarring. Other: No supplemental non-categorized findings. IMPRESSION: 1. No acute intracranial findings; no acute cervical spine findings. 2. Stable cerebellar atrophy and stable left frontal remote infarct. 3. Periventricular white matter and corona radiata hypodensities favor chronic ischemic microvascular white matter disease. 4. Areas of calvarial flattening/narrowing along the biparietal region and along the vertex, unchanged from prior. 5. Chronic right sphenoid and ethmoid sinusitis. 6. Cervical spondylosis and degenerative disc disease causing mostly mild foraminal narrowing at C3-4, C4-5, C5-6, and C7-T1. 7. Atherosclerosis. Electronically Signed   By: Gaylyn Rong M.D.   On: 04/04/2018 12:15    ____________________________________________   PROCEDURES  Procedure(s) performed:   Procedures  Critical Care  performed:   ____________________________________________   INITIAL IMPRESSION / ASSESSMENT AND PLAN / ED COURSE    Patient looks well we will let her go home.       ____________________________________________   FINAL CLINICAL IMPRESSION(S) / ED DIAGNOSES  Final diagnoses:  Fall, initial encounter     ED Discharge Orders    None       Note:  This document was prepared using Dragon voice recognition software and may include unintentional dictation errors.    Arnaldo Natal, MD 04/04/18 1224

## 2018-04-04 NOTE — ED Triage Notes (Signed)
Pt to ER via EMS from [facility] with c/o loosing her footing and falling to floor from standing.  Pt denies c/o or injuries.  Facility sent pt out "to be checked".

## 2018-04-04 NOTE — ED Notes (Signed)
Pt states she fell on her right side from standing position, pt denies any injuries or pain from fall.

## 2018-04-13 ENCOUNTER — Ambulatory Visit (INDEPENDENT_AMBULATORY_CARE_PROVIDER_SITE_OTHER): Payer: Medicare Other | Admitting: Podiatry

## 2018-04-13 ENCOUNTER — Encounter: Payer: Self-pay | Admitting: Podiatry

## 2018-04-13 DIAGNOSIS — B351 Tinea unguium: Secondary | ICD-10-CM | POA: Diagnosis not present

## 2018-04-13 DIAGNOSIS — M79676 Pain in unspecified toe(s): Secondary | ICD-10-CM | POA: Diagnosis not present

## 2018-04-13 NOTE — Progress Notes (Signed)
She presents today chief complaint painful elongated toenails.  Objective: Vital signs are stable she is alert oriented x3 toenails are long thick yellow dystrophic-like mycotic painful palpation sharply incurvated nail margin tender on palpation and debridement.  Assessment: Pain in limb secondary to onychomycosis and painful ingrown nails.  Plan: Debridement of toenails 1 through 5 bilateral covered service secondary to pain.  She will be following up with Dr. from her extended care facility for debridement I will follow-up with him on an as-needed basis.

## 2018-08-10 DEATH — deceased

## 2019-10-24 IMAGING — CT CT CERVICAL SPINE W/O CM
4 of 7 series · 15 of 33 positions shown, 16 images · non-contrast
Comparison: CT head from 01/26/2018

CLINICAL DATA: Fall from standing height with head trauma.

EXAM:
CT HEAD WITHOUT CONTRAST
CT CERVICAL SPINE WITHOUT CONTRAST
TECHNIQUE: Multidetector CT imaging of the head and cervical spine was
performed following the standard protocol without intravenous
contrast. Multiplanar CT image reconstructions of the cervical spine
were also generated.

[Series 5: coronal soft tissue · coronal · 0.30mm/px · 3 of 67 slices shown]
[im 17/67  bone]
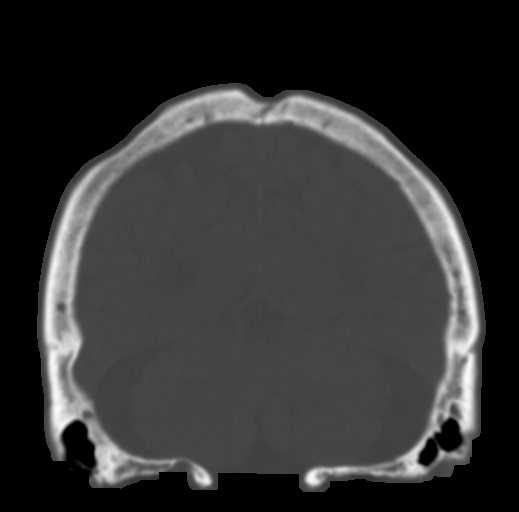
[im 34/67  bone]
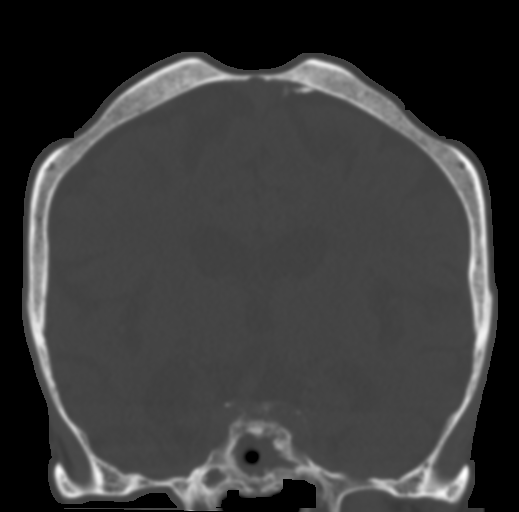
[im 50/67  bone]
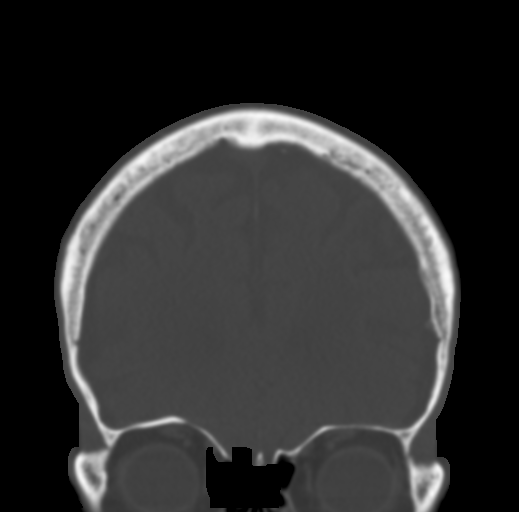

[Series 7: c spine soft · axial · 0.37mm/px · z∈[-261,-189]mm · 3 of 73 slices shown]
[im 19/73  soft-tissue]
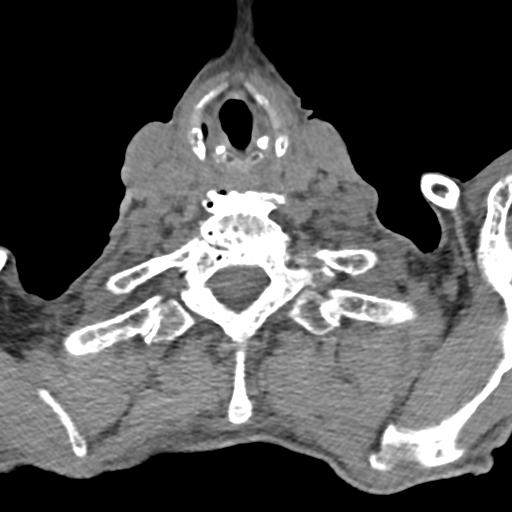
[im 37/73  soft-tissue]
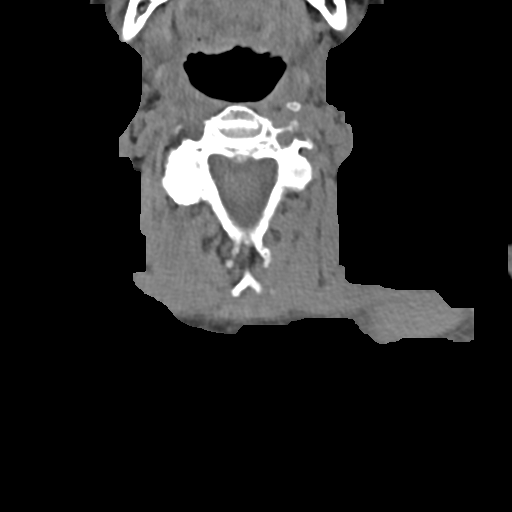
[im 55/73  soft-tissue]
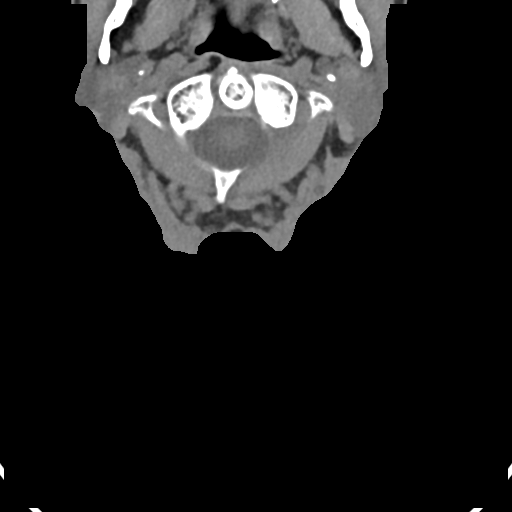

[Series 10: sagittal bone · sagittal · 0.25mm/px · 5 of 57 slices shown]
[im 10/57  bone]
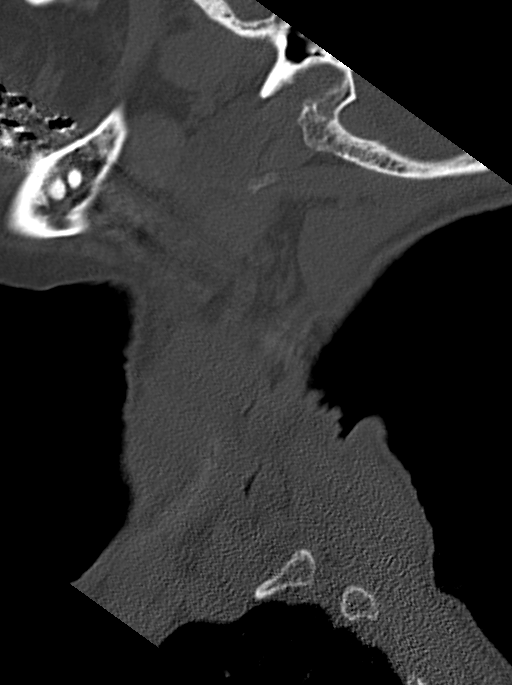
[im 19/57  bone]
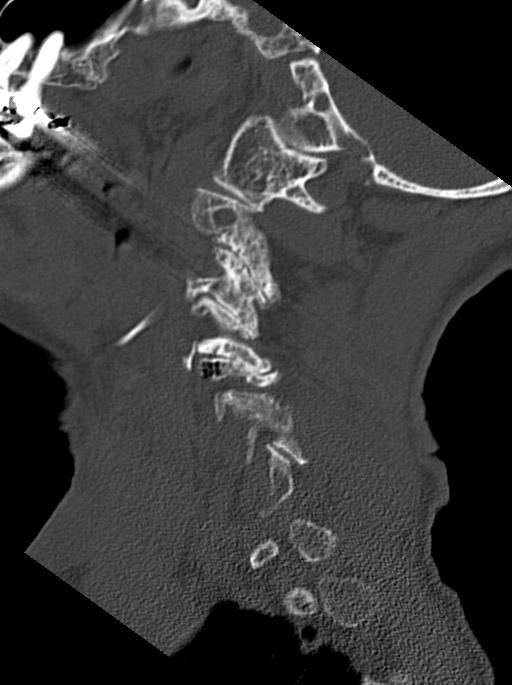
[im 29/57  bone]
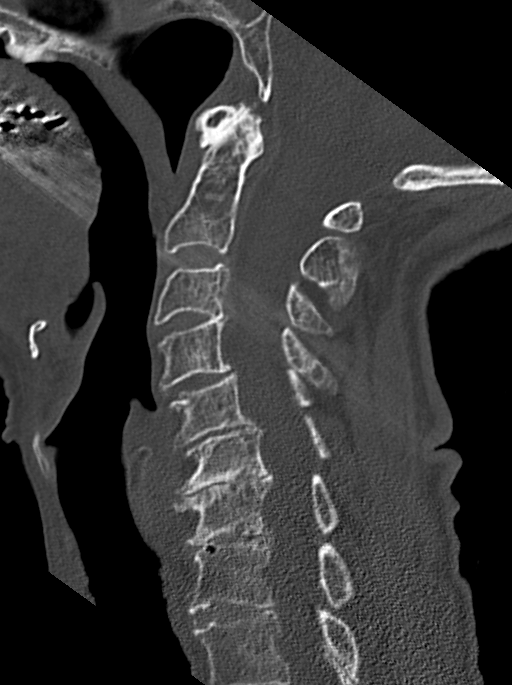
[im 38/57  bone]
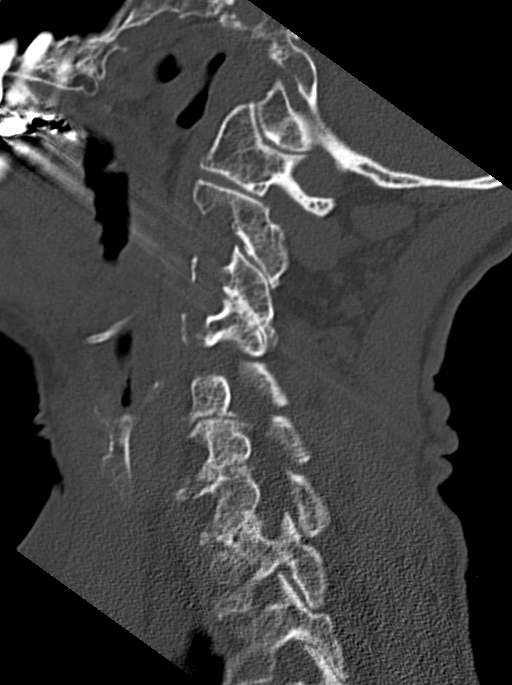
[im 47/57  bone]
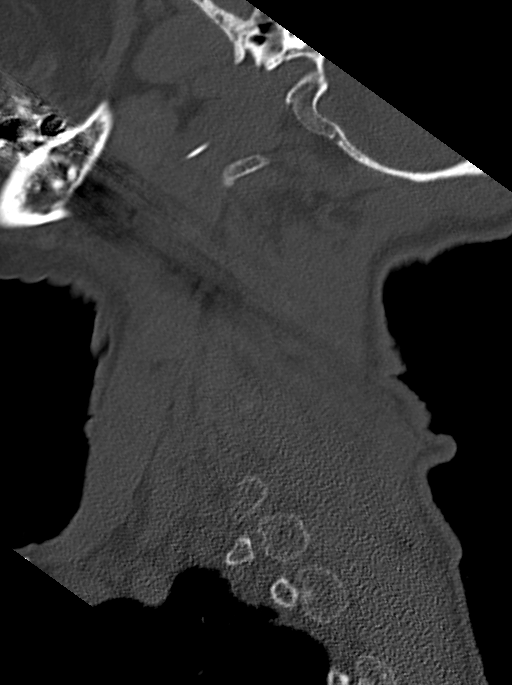

[Series 12: orthogonal bone · axial · 0.22mm/px · z∈[-300,-218]mm · 4 of 86 slices shown, 5 images]
[im 18/86  soft-tissue]
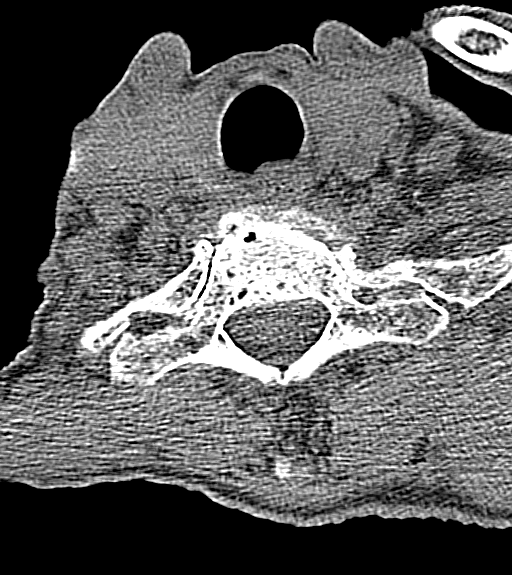
[im 18/86  bone]
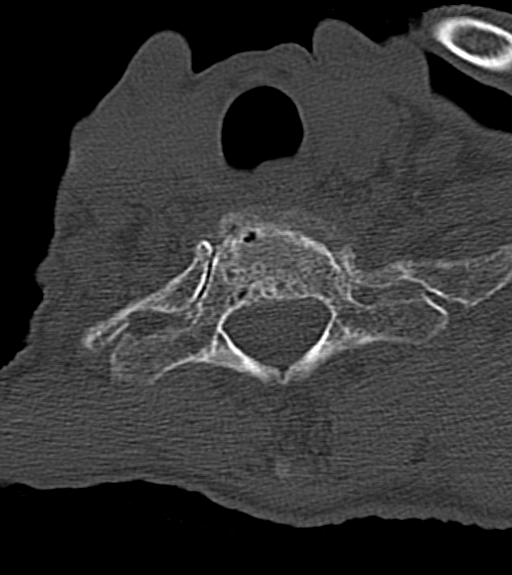
[im 35/86  bone]
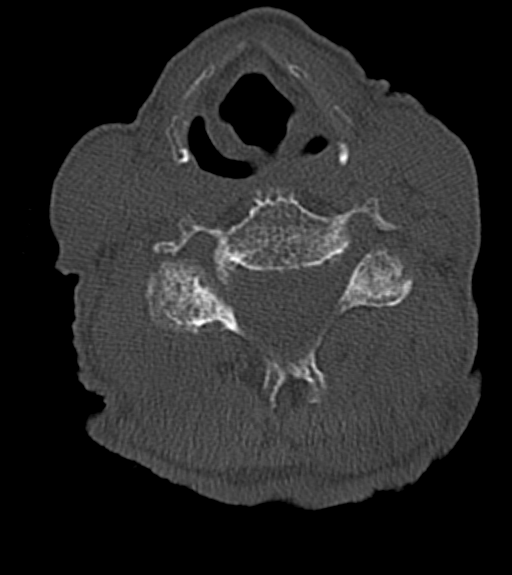
[im 52/86  bone]
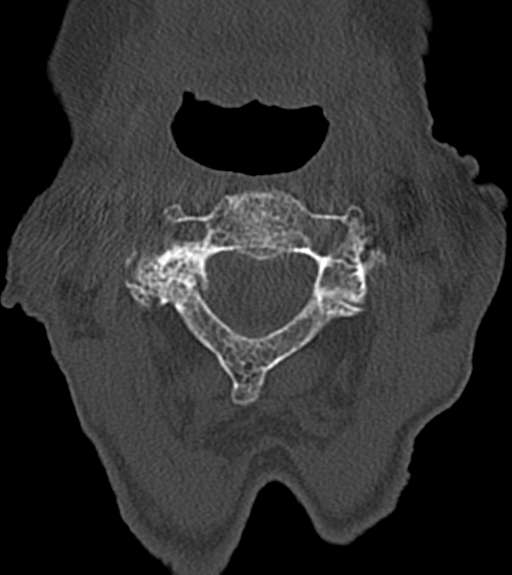
[im 69/86  bone]
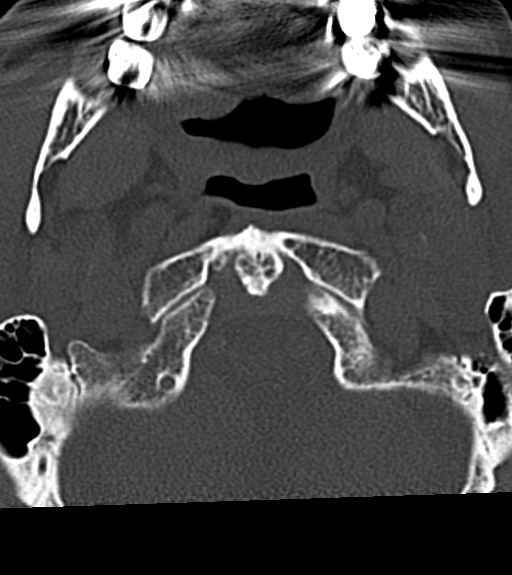

[15 of 33 positions shown; findings below may reference images not displayed]

FINDINGS: CT HEAD FINDINGS

Brain: Mild cerebellar atrophy with prominence adjacent extra-axial
spaces as on the prior exam.

Periventricular white matter and corona radiata hypodensities favor
chronic ischemic microvascular white matter disease. Stable
encephalomalacia in the left frontal lobe on image [DATE].

No intracranial hemorrhage, mass lesion, or acute CVA.

Small remote left cerebellar lacunar infarcts suspected on image
[DATE].

Vascular: There is atherosclerotic calcification of the cavernous
carotid arteries bilaterally. Mild calcification of the vertebral
arteries.

Skull: Biparietal thinning/flattening of the calvarium as on the
prior exam. There is also a region of prominent thinning of the
calvarial vertex as on image 38/5. These findings are unchanged from
previous.

Sinuses/Orbits: Chronic right sphenoid sinusitis with slightly more
aeration than on the prior exam. Calcification along nodular
portions the right sphenoid sinus could be from polyp or mucous
retention cyst. Mild chronic ethmoid sinusitis.

Other: No supplemental non-categorized findings.

CT CERVICAL SPINE FINDINGS

Alignment: There is 2.5 mm degenerative anterolisthesis at C4-5 and
1.5 mm degenerative anterolisthesis at C5-6.

Skull base and vertebrae: Degenerative predental space narrowing. No
cervical spine fracture is observed. Possible fusion of the right
C5-6 facet. Endplate sclerosis and prominent loss of intervertebral
disc height at C6-7 with moderate loss of disc height at C5-6 and
C7-T1.

Soft tissues and spinal canal: Bilateral common carotid
atherosclerotic calcification.

Disc levels: Uncinate and facet spurring cause osseous foraminal
narrowing on the right at C3-4, C4-5, and C5-6; and on the left at
C5-6 and C7-T1.

Upper chest: Biapical pleuroparenchymal scarring.

Other: No supplemental non-categorized findings.
IMPRESSION: 1. No acute intracranial findings; no acute cervical spine findings.
2. Stable cerebellar atrophy and stable left frontal remote infarct.
3. Periventricular white matter and corona radiata hypodensities
favor chronic ischemic microvascular white matter disease.
4. Areas of calvarial flattening/narrowing along the biparietal
region and along the vertex, unchanged from prior.
5. Chronic right sphenoid and ethmoid sinusitis.
6. Cervical spondylosis and degenerative disc disease causing mostly
mild foraminal narrowing at C3-4, C4-5, C5-6, and C7-T1.
7. Atherosclerosis.
